# Patient Record
Sex: Female | Born: 1950 | Hispanic: No | Smoking: Never smoker
Health system: Southern US, Community
[De-identification: ages and names within clinical notes are randomized; demographics above are authoritative.]

## PROBLEM LIST (undated history)

## (undated) DIAGNOSIS — I10 Essential (primary) hypertension: Secondary | ICD-10-CM

## (undated) DIAGNOSIS — M25519 Pain in unspecified shoulder: Secondary | ICD-10-CM

## (undated) DIAGNOSIS — G8929 Other chronic pain: Secondary | ICD-10-CM

## (undated) DIAGNOSIS — E785 Hyperlipidemia, unspecified: Secondary | ICD-10-CM

## (undated) DIAGNOSIS — I2510 Atherosclerotic heart disease of native coronary artery without angina pectoris: Secondary | ICD-10-CM

## (undated) DIAGNOSIS — M549 Dorsalgia, unspecified: Secondary | ICD-10-CM

## (undated) DIAGNOSIS — E039 Hypothyroidism, unspecified: Secondary | ICD-10-CM

## (undated) HISTORY — DX: Hyperlipidemia, unspecified: E78.5

## (undated) HISTORY — DX: Essential (primary) hypertension: I10

## (undated) HISTORY — PX: BACK SURGERY: SHX140

## (undated) HISTORY — DX: Atherosclerotic heart disease of native coronary artery without angina pectoris: I25.10

## (undated) HISTORY — PX: OTHER SURGICAL HISTORY: SHX169

## (undated) HISTORY — DX: Dorsalgia, unspecified: M54.9

## (undated) HISTORY — DX: Hypothyroidism, unspecified: E03.9

## (undated) HISTORY — DX: Other chronic pain: G89.29

## (undated) HISTORY — DX: Pain in unspecified shoulder: M25.519

## (undated) HISTORY — PX: SHOULDER SURGERY: SHX246

---

## 2005-12-04 ENCOUNTER — Encounter: Admission: RE | Admit: 2005-12-04 | Discharge: 2005-12-04 | Payer: Self-pay | Admitting: Occupational Medicine

## 2006-07-16 ENCOUNTER — Other Ambulatory Visit: Admission: RE | Admit: 2006-07-16 | Discharge: 2006-07-16 | Payer: Self-pay | Admitting: Family Medicine

## 2007-01-08 ENCOUNTER — Encounter: Admission: RE | Admit: 2007-01-08 | Discharge: 2007-01-08 | Payer: Self-pay | Admitting: Family Medicine

## 2007-10-14 ENCOUNTER — Encounter: Admission: RE | Admit: 2007-10-14 | Discharge: 2007-10-14 | Payer: Self-pay | Admitting: Occupational Medicine

## 2007-10-30 ENCOUNTER — Ambulatory Visit (HOSPITAL_COMMUNITY): Admission: RE | Admit: 2007-10-30 | Discharge: 2007-10-30 | Payer: Self-pay | Admitting: Internal Medicine

## 2007-10-31 ENCOUNTER — Encounter: Admission: RE | Admit: 2007-10-31 | Discharge: 2007-12-26 | Payer: Self-pay | Admitting: Occupational Medicine

## 2008-01-30 ENCOUNTER — Other Ambulatory Visit: Admission: RE | Admit: 2008-01-30 | Discharge: 2008-01-30 | Payer: Self-pay | Admitting: Family Medicine

## 2008-08-19 ENCOUNTER — Encounter: Admission: RE | Admit: 2008-08-19 | Discharge: 2008-09-03 | Payer: Self-pay | Admitting: Family Medicine

## 2009-02-04 ENCOUNTER — Other Ambulatory Visit: Admission: RE | Admit: 2009-02-04 | Discharge: 2009-02-04 | Payer: Self-pay | Admitting: Family Medicine

## 2010-02-07 ENCOUNTER — Other Ambulatory Visit: Admission: RE | Admit: 2010-02-07 | Discharge: 2010-02-07 | Payer: Self-pay | Admitting: Family Medicine

## 2010-04-13 ENCOUNTER — Encounter: Admission: RE | Admit: 2010-04-13 | Discharge: 2010-05-30 | Payer: Self-pay | Admitting: Family Medicine

## 2011-02-06 ENCOUNTER — Inpatient Hospital Stay (HOSPITAL_COMMUNITY)
Admission: AD | Admit: 2011-02-06 | Discharge: 2011-02-08 | DRG: 287 | Disposition: A | Discharge status: Home / Self Care | Payer: 59 | Source: Ambulatory Visit | Attending: Cardiology | Admitting: Cardiology

## 2011-02-06 ENCOUNTER — Emergency Department (INDEPENDENT_AMBULATORY_CARE_PROVIDER_SITE_OTHER): Payer: 59

## 2011-02-06 ENCOUNTER — Emergency Department (HOSPITAL_BASED_OUTPATIENT_CLINIC_OR_DEPARTMENT_OTHER)
Admission: EM | Admit: 2011-02-06 | Discharge: 2011-02-06 | Disposition: A | Discharge status: Other Acute Inpatient Hospital | Payer: 59 | Attending: Emergency Medicine | Admitting: Emergency Medicine

## 2011-02-06 ENCOUNTER — Inpatient Hospital Stay: Admission: EM | Admit: 2011-02-06 | Payer: Self-pay | Admitting: Cardiology

## 2011-02-06 DIAGNOSIS — R079 Chest pain, unspecified: Secondary | ICD-10-CM

## 2011-02-06 DIAGNOSIS — E785 Hyperlipidemia, unspecified: Secondary | ICD-10-CM | POA: Diagnosis present

## 2011-02-06 DIAGNOSIS — M545 Low back pain, unspecified: Secondary | ICD-10-CM | POA: Diagnosis present

## 2011-02-06 DIAGNOSIS — R0789 Other chest pain: Principal | ICD-10-CM | POA: Diagnosis present

## 2011-02-06 DIAGNOSIS — I359 Nonrheumatic aortic valve disorder, unspecified: Secondary | ICD-10-CM

## 2011-02-06 DIAGNOSIS — Z7982 Long term (current) use of aspirin: Secondary | ICD-10-CM

## 2011-02-06 DIAGNOSIS — I2510 Atherosclerotic heart disease of native coronary artery without angina pectoris: Secondary | ICD-10-CM | POA: Diagnosis present

## 2011-02-06 DIAGNOSIS — I10 Essential (primary) hypertension: Secondary | ICD-10-CM

## 2011-02-06 DIAGNOSIS — E039 Hypothyroidism, unspecified: Secondary | ICD-10-CM | POA: Diagnosis present

## 2011-02-06 DIAGNOSIS — M25519 Pain in unspecified shoulder: Secondary | ICD-10-CM | POA: Diagnosis present

## 2011-02-06 DIAGNOSIS — G8929 Other chronic pain: Secondary | ICD-10-CM | POA: Diagnosis present

## 2011-02-06 DIAGNOSIS — Z79899 Other long term (current) drug therapy: Secondary | ICD-10-CM | POA: Insufficient documentation

## 2011-02-06 DIAGNOSIS — R0602 Shortness of breath: Secondary | ICD-10-CM | POA: Insufficient documentation

## 2011-02-06 DIAGNOSIS — R9431 Abnormal electrocardiogram [ECG] [EKG]: Secondary | ICD-10-CM | POA: Insufficient documentation

## 2011-02-06 HISTORY — PX: CARDIAC CATHETERIZATION: SHX172

## 2011-02-06 LAB — PRO B NATRIURETIC PEPTIDE: Pro B Natriuretic peptide (BNP): 111.7 pg/mL (ref 0–125)

## 2011-02-06 LAB — DIFFERENTIAL
Basophils Relative: 0 % (ref 0–1)
Eosinophils Absolute: 0.7 10*3/uL (ref 0.0–0.7)
Lymphocytes Relative: 17 % (ref 12–46)
Lymphs Abs: 1.3 10*3/uL (ref 0.7–4.0)
Monocytes Relative: 9 % (ref 3–12)
Neutro Abs: 4.6 10*3/uL (ref 1.7–7.7)

## 2011-02-06 LAB — CBC
HCT: 38.1 % (ref 36.0–46.0)
Hemoglobin: 12.8 g/dL (ref 12.0–15.0)
RBC: 4.28 MIL/uL (ref 3.87–5.11)
RDW: 11.8 % (ref 11.5–15.5)

## 2011-02-06 LAB — COMPREHENSIVE METABOLIC PANEL
CO2: 27 mEq/L (ref 19–32)
Creatinine, Ser: 0.83 mg/dL (ref 0.4–1.2)
GFR calc non Af Amer: >60 mL/min (ref >60)
Glucose, Bld: 142 mg/dL — ABNORMAL HIGH (ref 70–99)
Sodium: 140 mEq/L (ref 135–145)
Total Bilirubin: 0.4 mg/dL (ref 0.3–1.2)
Total Protein: 6.8 g/dL (ref 6.0–8.3)

## 2011-02-06 LAB — MRSA PCR SCREENING: MRSA by PCR: NEGATIVE

## 2011-02-06 LAB — CARDIAC PANEL(CRET KIN+CKTOT+MB+TROPI)
CK, MB: 2 ng/mL (ref 0.3–4.0)
Relative Index: 1.6 (ref 0.0–2.5)

## 2011-02-06 LAB — BASIC METABOLIC PANEL
Calcium: 9.9 mg/dL (ref 8.4–10.5)
Chloride: 102 mEq/L (ref 96–112)
GFR calc Af Amer: >60 mL/min (ref >60)
GFR calc non Af Amer: >60 mL/min (ref >60)
Glucose, Bld: 99 mg/dL (ref 70–99)
Sodium: 139 mEq/L (ref 135–145)

## 2011-02-06 LAB — CK TOTAL AND CKMB (NOT AT ARMC): Relative Index: 1.7 (ref 0.0–2.5)

## 2011-02-06 LAB — D-DIMER, QUANTITATIVE: D-Dimer, Quant: 0.45 ug/mL-FEU (ref 0.00–0.48)

## 2011-02-06 LAB — TSH: TSH: 4.524 u[IU]/mL — ABNORMAL HIGH (ref 0.350–4.500)

## 2011-02-07 ENCOUNTER — Inpatient Hospital Stay (HOSPITAL_COMMUNITY): Payer: 59

## 2011-02-07 DIAGNOSIS — I359 Nonrheumatic aortic valve disorder, unspecified: Secondary | ICD-10-CM

## 2011-02-07 DIAGNOSIS — R0989 Other specified symptoms and signs involving the circulatory and respiratory systems: Secondary | ICD-10-CM

## 2011-02-07 LAB — URINE MICROSCOPIC-ADD ON

## 2011-02-07 LAB — CARDIAC PANEL(CRET KIN+CKTOT+MB+TROPI)
Relative Index: 1.5 (ref 0.0–2.5)
Total CK: 108 U/L (ref 7–177)
Troponin I: <0.3 ng/mL (ref <0.30)

## 2011-02-07 LAB — TSH: TSH: 6.586 u[IU]/mL — ABNORMAL HIGH (ref 0.350–4.500)

## 2011-02-07 LAB — COMPREHENSIVE METABOLIC PANEL
ALT: 14 U/L (ref 0–35)
AST: 18 U/L (ref 0–37)
Calcium: 9 mg/dL (ref 8.4–10.5)
GFR calc non Af Amer: >60 mL/min (ref >60)
Total Bilirubin: 0.5 mg/dL (ref 0.3–1.2)
Total Protein: 7.2 g/dL (ref 6.0–8.3)

## 2011-02-07 LAB — CBC
MCH: 30.4 pg (ref 26.0–34.0)
MCHC: 33.8 g/dL (ref 30.0–36.0)
RDW: 12.1 % (ref 11.5–15.5)

## 2011-02-07 LAB — URINALYSIS, ROUTINE W REFLEX MICROSCOPIC
Bilirubin Urine: NEGATIVE
Specific Gravity, Urine: 1.006 (ref 1.005–1.030)
pH: 6 (ref 5.0–8.0)

## 2011-02-07 LAB — LIPID PANEL
Triglycerides: 130 mg/dL (ref <150)
VLDL: 26 mg/dL (ref 0–40)

## 2011-02-14 LAB — PULMONARY FUNCTION TEST

## 2011-02-21 ENCOUNTER — Encounter: Payer: Self-pay | Admitting: Physician Assistant

## 2011-02-22 ENCOUNTER — Ambulatory Visit (INDEPENDENT_AMBULATORY_CARE_PROVIDER_SITE_OTHER): Payer: 59 | Admitting: Physician Assistant

## 2011-02-22 ENCOUNTER — Encounter: Payer: Self-pay | Admitting: Physician Assistant

## 2011-02-22 ENCOUNTER — Encounter: Payer: 59 | Admitting: Physician Assistant

## 2011-02-22 VITALS — BP 135/69 | HR 58 | Ht 60.0 in | Wt 138.0 lb

## 2011-02-22 DIAGNOSIS — I10 Essential (primary) hypertension: Secondary | ICD-10-CM | POA: Insufficient documentation

## 2011-02-22 DIAGNOSIS — R0602 Shortness of breath: Secondary | ICD-10-CM

## 2011-02-22 DIAGNOSIS — I2510 Atherosclerotic heart disease of native coronary artery without angina pectoris: Secondary | ICD-10-CM | POA: Insufficient documentation

## 2011-02-22 DIAGNOSIS — E785 Hyperlipidemia, unspecified: Secondary | ICD-10-CM | POA: Insufficient documentation

## 2011-02-22 DIAGNOSIS — E039 Hypothyroidism, unspecified: Secondary | ICD-10-CM | POA: Insufficient documentation

## 2011-02-22 NOTE — Assessment & Plan Note (Signed)
Continue aspirin and statin.  Follow up with [name] in 2-3 months.

## 2011-02-22 NOTE — Assessment & Plan Note (Signed)
Controlled.  

## 2011-02-22 NOTE — Progress Notes (Signed)
History of Present Illness: Primary Cardiologist:  Dr.  [name] is a 60 y.o. female who was admitted To [facility] 5/7-5/9 with chest pain.  Cardiac catheterization demonstrated nonobstructive CAD with a 40-50% mid LAD lesion that may have been intramyocardial.  Her EF was 60%.  Echocardiogram demonstrated an EF of 55-60%, mild AI and no pericardial effusion.  BNP was normal.  D-dimer was negative.  Carotid Dopplers were negative for ICA stenosis.  Her abdominal ultrasound was normal.  She ruled out for myocardial infarction by enzymes.  Her lipids were optimal with an LDL of 69.  TSH was minimally elevated at 4.524.  She returns today for follow up.  She continues to have chest discomfort and shortness of breath with exertion.  She does wheeze.  She has an occasional cough.  Her symptoms are somewhat improved since being in the hospital.  She denies orthopnea, PND or edema.  She denies significant weight gain.  She denies syncope.  She has seen her PCP and follow up.  It sounds like she had pulmonary function tests.  She states that these are abnormal and she is pending a referral to the pulmonologist.  Past Medical History  Diagnosis Date  . Hypertension   . Hyperlipidemia     followed by PCP  . Hypothyroidism   . Back pain, chronic   . Chronic shoulder pain   . Chest pain 01/2011    unclear etiology  . Coronary artery disease     NONOBSTRUCTIVE PER CATH 02/06/11 ( mid LAD 40-50% - ? intramyocardial); EF 60%;   Echo 5/12: EF 55-60%, mild AI, normal wall motion, no pericardial eff.    Current Outpatient Prescriptions  Medication Sig Dispense Refill  . aspirin 81 MG EC tablet Take 81 mg by mouth daily.        Marland Kitchen atenolol-chlorthalidone (TENORETIC) 50-25 MG per tablet Take 0.5 tablets by mouth daily.        . fish oil-omega-3 fatty acids 1000 MG capsule Take 1 g by mouth daily.        . Glucosamine HCl 1000 MG TABS Take 1 tablet by mouth 2 (two) times daily.        Marland Kitchen  levothyroxine (SYNTHROID, LEVOTHROID) 50 MCG tablet Take 50 mcg by mouth daily.        Marland Kitchen loratadine (CLARITIN) 10 MG tablet Take 10 mg by mouth daily.        . Multiple Vitamin (MULTIVITAMIN) tablet Take 1 tablet by mouth daily.        . simvastatin (ZOCOR) 10 MG tablet Take 10 mg by mouth at bedtime.        Marland Kitchen DISCONTD: cyclobenzaprine (FLEXERIL) 10 MG tablet Take 10 mg by mouth every 6 (six) hours as needed.          Allergies: No Known Allergies  History  Substance Use Topics  . Smoking status: Never Smoker   . Smokeless tobacco: Never Used  . Alcohol Use: No    Vital Signs: BP 135/69  Pulse 58  Ht 5' (1.524 m)  Wt 138 lb (62.596 kg)  BMI 26.95 kg/m2  PHYSICAL EXAM: Well nourished, well developed, in no acute distress HEENT: normal Neck: no JVD Vascular: No carotid bruits Endocrine: No thyromegaly Cardiac:  normal S1, S2; RRR; no murmur Lungs:  clear to auscultation bilaterally, no wheezing, rhonchi or rales Abd: soft, nontender, no hepatomegaly Ext: no edema Skin: warm and dry Neuro:  CNs 2-12 intact, no  focal abnormalities noted  EKG:  Sinus bradycardia, heart rate 55, left axis deviation, nonspecific ST-T wave changes, incomplete right bundle branch block, no significant change when compared to prior tracings  ASSESSMENT AND PLAN:

## 2011-02-22 NOTE — Assessment & Plan Note (Signed)
Managed by PCP.  Goal LDL < 70. 

## 2011-02-22 NOTE — Patient Instructions (Signed)
Your physician recommends that you schedule a follow-up appointment in: 2-3 MONTHS WITH [name] AS PER SCOTT WEAVER, PA-C.

## 2011-02-22 NOTE — Assessment & Plan Note (Signed)
Her symptoms do not appear to be cardiac.  It sounds as though she has had a recent pulmonary function test that was abnormal.  I suspect the etiology of her symptoms are related to a pulmonary issue.  I have encouraged her to go ahead and see a pulmonologist for further evaluation.

## 2011-03-02 NOTE — Cardiovascular Report (Signed)
NAMEANASTASYA, Michele Cox                    ACCOUNT NO.:  [id number]  MEDICAL RECORD NO.:  [id number]           PATIENT TYPE:  I  LOCATION:  2922                         FACILITY:  [facility]  PHYSICIAN:  Arturo Morton. Riley Kill, MD, FACCDATE OF BIRTH:  [birth year]  DATE OF PROCEDURE:  02/06/2011 DATE OF DISCHARGE:                           CARDIAC CATHETERIZATION   INDICATIONS:  Ms. Soria is a 60 year old who has had several Droge history of chest pain and shortness of breath.  The mechanisms of this are unclear.  [facility] with ongoing chest pain.  EKG was not diagnostic.  First set of enzymes were negative.  She was seen by Dr. [name], referred for urgent cardiac catheterization.  The patient arrived from the external facility to the cath lab directly where she was examined and the data evaluated.  PROCEDURE: 1. Left heart catheterization. 2. Selective coronary arteriography. 3. Selective left ventriculography. 4. Aortic root aortography.  DESCRIPTION OF PROCEDURE:  The patient was brought to the cath lab and prepped and draped in usual fashion.  We obtained informed consent. Following this, 1 mg of Versed and 25 of fentanyl was given followed by a second dose of Versed.  The right femoral artery was easily entered through an anterior puncture, 5-French sheath was placed.  Views of the left coronary artery were obtained and carefully analyzed.  Views of the right coronary artery were obtained with [facility] catheter.  Central aortic and left ventricular pressures were measured with pigtail. Ventriculography was performed in the RAO projection.  Following a pressure, pullback aortography was performed.  There were no major complications.  ACT was checked and she was taken to the holding area for sheath removal.  I have spoke with her family about the findings. She is admitted for further observation.  HEMODYNAMIC DATA: 1. The central aortic pressure is 145/70, mean  100. 2. LV pressure 152/11. 3. There was not a gradient pullback across the aortic valve.  ANGIOGRAPHIC DATA: 1. Ventriculography done in the RAO projection reveals well-preserved     global systolic function.  No segmental abnormalities or     contraction were identified.  Ejection fraction would be estimated     in the range of 60%. 2. The aortic root demonstrates no evidence of significant aortic     regurgitation.  The aortic root does not appear to be enlarged.  No     obvious dissection lines are observed.  Takeoff from the great     vessels appears to be appropriate. 3. The left main is free of critical disease.  He has an unusual     appearance on the initial views, but multiple views lay this out     demonstrating that the LAD itself has a turn in its takeoff in the     views in which it looks abnormal.  There is also a small first     marginal branch which has an upward takeoff and retrograde     appearance, which could also provide this. 4. The LAD courses to the apex.  After the first two  diagonal     branches, there is some narrowing in the LAD of about 40%-50%.     This was brought out in several views including up to and involving     left lateral view.  The mechanism of this narrowing is unclear.  It     could be part of intramyocardial.  It could represent     atherosclerotic disease less likely option would be external     compression from spontaneous dissection, although I there is not     other compromise of branch vessels to suggest external hematoma.     The distal LAD wraps the apex. 5. The circumflex marginal provides a trifurcating marginal branch     which is very large and free of critical disease. 6. The right coronary artery is large-caliber vessel providing     posterior descending and posterolateral branch.  CONCLUSION: 1. Well-preserved left ventricular function without wall motion     abnormality. 2. Aortic root without evidence of significant  dissection. 3. Modest narrowing of the mid left anterior descending artery without     critical narrowing despite ongoing pain.  Mechanism of pain is     unclear.  DISPOSITION:  The patient will be admitted and serial enzymes obtained. We will get a D-dimer.  Other sources of chest pain will be sought.  She will be monitored for approximately 48 hours.     Arturo Morton. Riley Kill, MD, [facility]     TDS/MEDQ  D:  02/06/2011  T:  02/07/2011  Job:  [id number]  cc:   Sunnie Nielsen, MD CV Laboratory [place], Alita Chyle  Electronically Signed by Shawnie Pons MD [facility] on 03/02/2011 09:18:20 AM

## 2011-03-02 NOTE — H&P (Signed)
Michele Cox, Michele Cox                    ACCOUNT NO.:  [id number]  MEDICAL RECORD NO.:  [id number]           PATIENT TYPE:  I  LOCATION:  2922                         FACILITY:  [facility]  PHYSICIAN:  Arturo Morton. Riley Kill, MD, FACCDATE OF BIRTH:  1950-11-09  DATE OF ADMISSION:  02/06/2011 DATE OF DISCHARGE:                             HISTORY & PHYSICAL   CHIEF COMPLAINT:  Chest pain.  HISTORY OF PRESENT ILLNESS:  The patient is a 60 year old female with a past medical history of hypertension, hyperlipidemia, hypothyroidism, lower back pain who presented to the [facility] today complaining of retrosternal chest pain ongoing for the past 2 days.  The pain was constant 6/10 in intensity, no radiation.  Chest pain appeared pleuritic as it worsened with inspiration, the patient denied any other complaints.  Upon arrival to [facility], she was emergently catheterized given her moderate risk of coronary artery disease and nonspecific changes on her EKGs.  [name] performed a cardiac catheterization which was negative for critical disease.  Her first cardiac enzyme at that time came back negative for myocardial injury.  The patient tolerated the procedure well.  She is sent to telemetry for further monitoring and workup of her chest pain.  PAST MEDICAL HISTORY:  Hypothyroidism, hypertension, hyperlipidemia, chronic lower back pain, and shoulder pain.  ALLERGIES:  No known drug allergies.  MEDICATIONS: 1. Synthroid 50 mcg. 2. Simvastatin 10 mg. 3. Atenolol/hydrochlorothiazide 50/25 mg daily. 4. Flexeril 10 mg q.6 h. p.r.n. back pain. 5. Loratadine 10 mg tablets daily. 6. Multivitamin 7. Glucosamine. 8. Fish oil.  SOCIAL HISTORY:  The patient lives in [place].  Denies any tobacco, alcohol or drug abuse.  FAMILY HISTORY:  Significant for premature coronary artery disease.  REVIEW OF SYSTEMS:  The patient denies fever, chills, sweating, nausea, vomiting, complains of  chest pain or shortness of breath.  No numbness. No neurological findings.  The patient is otherwise negative for review of systems except for per HPI.  PHYSICAL EXAMINATION:  VITAL SIGNS:  Temperature 98.4, pulse 54, respiratory rate 16, blood pressure 156/85, O2 saturation 97% on room air. GENERAL:  No acute distress. HEENT:  Normocephalic, atraumatic.  Pupils are equal, round, and reactive to light. NECK:  Supple.  No bruits.  No JVD. CARDIOVASCULAR:  Heart regular rate and rhythm.  No murmurs, rubs or gallops. LUNGS:  Clear to auscultation. SKIN:  No rashes. ABDOMEN:  Soft, nontender, nondistended.  Negative hepatosplenomegaly. EXTREMITIES:  No cyanosis, clubbing or edema. NEUROLOGICAL:  Alert and oriented x3.  Cranial nerves II-XII are grossly intact.  RADIOLOGY:  Chest x-ray shows accentuated interstitial markings.  No acute findings present.  EKG, rate 56 rhythm was sinus, axis is normal. PR interval was 164, QRS 96, QTc 405.  There was no specific ST or T wave changes.  No signs of hypertrophy.  No prior EKG to compare to.  LABORATORY FINDINGS:  White blood cell count 7.3, hemoglobin 12.8, hematocrit 38.1, platelet count 196.  Sodium 139, potassium 3.6, chloride 102, bicarb 26, BUN 9, creatinine 0.7, glucose 99.  First cardiac enzymes CK 167,  CK-MB 2.7, troponin less than 0.3.  ASSESSMENT AND PLAN:  Chest pain.  The patient was transferred here for chest pain for the past 2 days for which she went under emergent catheterization.  This was negative for critical lesions that would explain her chest pain.  The patient will be admitted to telemetry for further workup and monitoring.  Her chest x-ray was negative which  rules out pneumonia or pneumothorax.  Because of her presentation,  we will order a D-dimer to further workup her chest pain for potential  pulmonarym embolism.  Other differential diagnosis includes chronic back pain, GERD or musculoskeletal pain.  We will  continue the patient's monitoring on telemetry, cycle cardiac enzymes x3, give the patient aspirin, continue on her home medications and work to adjust her medication regiment for optimal medical management.     Darnelle Maffucci, MD   ______________________________ Arturo Morton. Riley Kill, MD, [facility]    PT/MEDQ  D:  02/06/2011  T:  02/07/2011  Job:  [id number]  Electronically Signed by Darnelle Maffucci  on 02/11/2011 11:48:48 AM Electronically Signed by Shawnie Pons MD [facility] on 03/02/2011 09:18:27 AM

## 2011-03-23 NOTE — Discharge Summary (Signed)
Michele Cox, Michele Cox                    ACCOUNT NO.:  [id number]  MEDICAL RECORD NO.:  [id number]           PATIENT TYPE:  I  LOCATION:  2922                         FACILITY:  [facility]  PHYSICIAN:  Veverly Fells. [name], MD  DATE OF BIRTH:  1951-04-22  DATE OF ADMISSION:  02/06/2011 DATE OF DISCHARGE:  02/08/2011                              DISCHARGE SUMMARY   PRIMARY CARDIOLOGIST:  Maisie Fus D. Riley Kill, MD, [facility]  PRIMARY CARE PROVIDER:  [name], MD at [facility].  DISCHARGE DIAGNOSES: 1. Chest pain, unclear etiology.     a.     Nonobstructive coronary artery disease per cardiac      catheterization. 2. Nonobstructive coronary artery disease per cardiac catheterization     on Feb 06, 2011.     a.     Moderate narrowing of the mid left anterior descending      artery without critical narrowing.     b.     Well-preserved left ventricular function without wall motion      abnormalities.  SECONDARY DIAGNOSES: 1. Hypertension. 2. Hyperlipidemia. 3. Hypothyroidism. 4. Chronic low back and shoulder pain.  ALLERGIES:  No known drug allergies.  PROCEDURES/DIAGNOSTICS PERFORMED DURING HOSPITALIZATION: 1. Left heart catheterization with selective coronary arteriography,     selective left ventriculography, and aortic root, aortography on     Feb 06, 2011.     a.     Well-preserved left ventricular function without wall motion      abnormality, ejection fraction 60%.     b.     Left anterior descending with some narrowing about 40-50%      after the first two diagonal branches.  Other vessels free of      critical disease.     c.     Aortic root without evidence of significant dissection. 2. Abdominal ultrasound on Feb 07, 2011:  Normal abdominal ultrasound. 3. Chest x-ray on Feb 07, 2011:  No acute cardiopulmonary disease     process. 4. Echo on Feb 07, 2011:  Left ventricle systolic function was normal,     estimated ejection fraction 55-60%.  No regional wall motion  abnormalities.  There is a mild focal basilar hypertrophy of the     septum.  Mild aortic regurgitation. 5. Carotid Dopplers:  Final report pending.  Preliminary result showed     no evidence of hemodynamically significant internal carotid artery     stenosis, vertebral artery flow is antegrade.  REASON FOR HOSPITALIZATION:  This is a 60 year old female with the above- stated problem list who presented to [facility] on the [name] of admission with complaints of retrosternal chest pain for the last 2 days.  Chest pain appeared pleuritic as it was worse with inspiration. The patient's EKG was nondiagnostic.  Her first set of cardiac markers was negative.  With the patient's moderate risk of coronary artery disease and nonspecific changes, Dr. [name] referred the patient for urgent cardiac catheterization.  Risks, benefits, and indications were discussed with the patient, and she agreed to proceed.  HOSPITAL COURSE:  The patient  was taken to the cardiac cath lab by [name], informed consent was obtained.  Catheterization demonstrated moderate narrowing of the mid left anterior descending artery without critical narrowing despite patient's ongoing pain.  I felt mechanism of the pain was unclear.  The patient was brought back to the floor for further workup.  A D-dimer was obtained to rule out for pulmonary embolus, this was within normal limits.  The patient did rule out for myocardial infarction with cardiac enzymes being negative x3.  A 2-D echo was obtained that was negative for pericardial effusion.  Her ejection fraction was within normal limits.  She did have mild focal basilar hypertrophy of the septum.  Of note, the patient did tolerate cardiac catheterization well in her cath site, right groin was without signs of hematoma.  The patient had no further complaints of chest pain.  She was mildly dyspneic, but this was improving during hospitalization.  An  abdominal ultrasound was also obtained, this was normal.  The patient did have carotid Dopplers as it was noted that she had a left carotid bruit. Pulmonary results showed no evidence of hemodynamically significant ICA stenosis.  There was vertebral artery flow in the antegrade direction. Final report is currently pending.  On the [place] of discharge, Dr. [name] evaluated the patient and noted her stable for home.  She was ambulating without difficulty.  Again, she had no further complaints of chest pain.  Her shortness of breath had improved.  Of note, the patient states she had been on atenolol/chlorthalidone, but her pharmacy was out of the combination meds and therefore given her atenolol and hydrochlorothiazide as separate pills.  The patient had discontinued use of her hydrochlorothiazide. Her combination med has been restarted during admission.  This will be continued at discharge.  Discharge plans and instructions were discussed with the patient.  She voiced understanding.  DISCHARGE LABORATORIES:  TSH 4.524, free T4 1.23.  Cholesterol 144, triglycerides 138, HDL 49, LDL 69.  Hemoglobin A1c 5.  Cardiac enzymes negative x3.  DISCHARGE MEDICATIONS: 1. Aspirin 81 mg 1 tablet daily. 2. Atenolol/chlorthalidone 50/25 mg half tablet daily. 3. Fish oil 1000 mg 1 capsule daily. 4. Glucosamine 1000 mg 2 tablets daily. 5. Levothyroxine 50 mcg 1 tablet daily. 6. Loratadine 10 mg 1 tablet daily. 7. Multivitamin 1 tablet daily. 8. Simvastatin 10 mg daily. 9. Please stop taking hydrochlorothiazide.  FOLLOWUP PLANS AND INSTRUCTIONS: 1. The patient will follow up with [name] in 10-14 days, the     office will call to schedule this appointment. 2. Follow up with primary care provider, [name] as previously     scheduled. 3. The patient is to increase activity slowly.  She may shower, but no     bathing.  No lifting for 1 week greater than 5 pounds.  No driving     for 1 [place].  No  sexual activity for 1 week.  She is to keep her cath     site clean and dry and call our office for any problems. 4. The patient is to continue low-sodium heart-healthy diet. 5. The patient to avoid straining. 6. The patient is to stop any activity that causes chest pain or     shortness of breath. 7. She is to call our office in the interim for any problems or     concerns.  DURATION OF DISCHARGE: Greater than 30 minutes with physician and physician extender time.    Leonette Monarch, PA-C   ______________________________ Casimiro Needle  Karren Burly, MD    NB/MEDQ  D:  02/08/2011  T:  02/08/2011  Job:  [id number]  cc:   Arturo Morton. Riley Kill, MD, [facility] Docia Chuck, MD  Electronically Signed by Alen Blew P.A. on 02/20/2011 11:50:23 AM Electronically Signed by Tonny Bollman MD on 03/23/2011 07:33:24 AM

## 2011-03-24 ENCOUNTER — Ambulatory Visit (INDEPENDENT_AMBULATORY_CARE_PROVIDER_SITE_OTHER): Payer: 59 | Admitting: Emergency Medicine

## 2011-03-24 ENCOUNTER — Encounter: Payer: Self-pay | Admitting: Emergency Medicine

## 2011-03-24 VITALS — BP 112/76 | HR 50 | Temp 98.2°F | Ht 60.0 in | Wt 136.2 lb

## 2011-03-24 DIAGNOSIS — R0602 Shortness of breath: Secondary | ICD-10-CM

## 2011-03-24 NOTE — Assessment & Plan Note (Signed)
-   will obtain and review PFT from [facility].  - ROV next available

## 2011-03-24 NOTE — Progress Notes (Signed)
Subjective:    Patient ID: Michele Cox, female    DOB: 21-Apr-1951, 60 y.o.   MRN: [id number]  HPI 60 yo never smoker, referred by Dr Luz Brazen for dyspnea. She began to experience dyspnea in May '12 after she stopped HCTZ. Was admitted with SOB and chest pain 02/08/11. Cardiac catheterization demonstrated nonobstructive CAD with a 40-50% mid LAD lesion that may have been intramyocardial. Her EF was 60%. Echocardiogram demonstrated an EF of 55-60%, mild AI and no pericardial effusion. Her breathing is somewhat improved but not baseline. She hears wheezing at times, has a dry cough. She reports that PFT have been done at [facility]. She has not been started on any meds yet - she thinks she may have benefited from the BD that they gave her for her spirometry.    Review of Systems  Respiratory: Positive for cough and shortness of breath.   Cardiovascular: Positive for palpitations.   Past Medical History  Diagnosis Date  . Hypertension   . Hyperlipidemia     followed by PCP  . Hypothyroidism   . Back pain, chronic   . Chronic shoulder pain   . Chest pain 01/2011    unclear etiology  . Coronary artery disease     NONOBSTRUCTIVE PER CATH 02/06/11 ( mid LAD 40-50% - ? intramyocardial); EF 60%;   Echo 5/12: EF 55-60%, mild AI, normal wall motion, no pericardial eff.     Family History  Problem Relation Age of Onset  . Coronary artery disease      PREMATURE CAD IN FAMILY     History   Social History  . Marital Status: Unknown    Spouse Name: N/A    Number of Children: N/A  . Years of Education: N/A   Occupational History  . disabled    Social History Main Topics  . Smoking status: Never Smoker   . Smokeless tobacco: Never Used  . Alcohol Use: No  . Drug Use: No  . Sexually Active: Not on file   Other Topics Concern  . Not on file   Social History Narrative  . No narrative on file     No Known Allergies   Outpatient Prescriptions Prior to Visit  Medication Sig Dispense  Refill  . aspirin 81 MG EC tablet Take 81 mg by mouth daily.        . fish oil-omega-3 fatty acids 1000 MG capsule Take 1 g by mouth daily.        . Glucosamine HCl 1000 MG TABS Take 1 tablet by mouth 2 (two) times daily.        Marland Kitchen levothyroxine (SYNTHROID, LEVOTHROID) 50 MCG tablet Take 50 mcg by mouth daily.        Marland Kitchen loratadine (CLARITIN) 10 MG tablet Take 10 mg by mouth daily.        . Multiple Vitamin (MULTIVITAMIN) tablet Take 1 tablet by mouth daily.        . simvastatin (ZOCOR) 10 MG tablet Take 10 mg by mouth at bedtime.        Marland Kitchen atenolol-chlorthalidone (TENORETIC) 50-25 MG per tablet Take 0.5 tablets by mouth daily.               Objective:   Physical Exam Gen: Pleasant, well-nourished, in no distress,  normal affect  ENT: No lesions,  mouth clear,  oropharynx clear, no postnasal drip  Neck: No JVD, no TMG, no carotid bruits  Lungs: No use of accessory muscles, no dullness to percussion, clear  without rales or rhonchi  Cardiovascular: RRR, heart sounds normal, no murmur or gallops, no peripheral edema  Musculoskeletal: R forearm with post-trauma deformity, scars  Neuro: alert, non focal  Skin: Warm, no lesions or rashes       Assessment & Plan:  Shortness of breath - will obtain and review PFT from [place].  - ROV next available

## 2011-03-24 NOTE — Patient Instructions (Signed)
We will obtain and review your breathing tests from [facility] We will review your CXR's Follow up with Dr Delton Coombes next available opening

## 2011-04-06 ENCOUNTER — Ambulatory Visit: Payer: 59 | Admitting: Emergency Medicine

## 2011-04-26 ENCOUNTER — Ambulatory Visit: Payer: 59 | Admitting: Cardiology

## 2011-05-03 ENCOUNTER — Ambulatory Visit (INDEPENDENT_AMBULATORY_CARE_PROVIDER_SITE_OTHER): Payer: 59 | Admitting: Emergency Medicine

## 2011-05-03 ENCOUNTER — Encounter: Payer: Self-pay | Admitting: Emergency Medicine

## 2011-05-03 VITALS — BP 108/72 | HR 86 | Temp 97.8°F | Ht 60.0 in | Wt 140.2 lb

## 2011-05-03 DIAGNOSIS — R0989 Other specified symptoms and signs involving the circulatory and respiratory systems: Secondary | ICD-10-CM

## 2011-05-03 DIAGNOSIS — R053 Chronic cough: Secondary | ICD-10-CM | POA: Insufficient documentation

## 2011-05-03 DIAGNOSIS — R05 Cough: Secondary | ICD-10-CM

## 2011-05-03 DIAGNOSIS — R0600 Dyspnea, unspecified: Secondary | ICD-10-CM

## 2011-05-03 DIAGNOSIS — R0602 Shortness of breath: Secondary | ICD-10-CM

## 2011-05-03 NOTE — Assessment & Plan Note (Signed)
Add Nasonex to loratadine

## 2011-05-03 NOTE — Patient Instructions (Addendum)
We will start Ventolin, 2 puffs up to every 4 hours if needed for shortness of breath.  We will perform full Pulmonary Function Testing Continue loratadine (Claritin) 10mg  daily Start Nasonex 2 sprays each nostril daily Follow up with Dr Delton Coombes in 6 weeks

## 2011-05-03 NOTE — Assessment & Plan Note (Addendum)
PFT from [place] showed mixed disease, possible obstruction (02/14/11). ? Etiology of the restriction. CXR w ? Hyperinflation 5/12 - repeat spirometry today shows mixed disease - order full PFT - start SABA prn to see if she benefits.  - rov 1 mon

## 2011-05-03 NOTE — Progress Notes (Signed)
  Subjective:    Patient ID: Michele Cox, female    DOB: May 14, 1951, 60 y.o.   MRN: [id number]  HPI 60 yo never smoker, referred by Dr Luz Brazen for dyspnea. She began to experience dyspnea in May '12 after she stopped HCTZ. Was admitted with SOB and chest pain 02/08/11. Cardiac catheterization demonstrated nonobstructive CAD with a 40-50% mid LAD lesion that may have been intramyocardial. Her EF was 60%. Echocardiogram demonstrated an EF of 55-60%, mild AI and no pericardial effusion. Her breathing is somewhat improved but not baseline. She hears wheezing at times, has a dry cough. She reports that PFT have been done at [facility]. She has not been started on any meds yet - she thinks she may have benefited from the BD that they gave her for her spirometry.   ROV 05/03/11 -- returns for follow up for DOE. No real changes from last time, still SOB when she rushes around, climbs stairs. She hasn't heard any wheeze with exertion. Her cough is about the same, dry. ? 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   Cardiovascular: Positive for palpitations.       Objective:   Physical Exam Gen: Pleasant, well-nourished, in no distress,  normal affect  ENT: No lesions,  mouth clear,  oropharynx clear, no postnasal drip  Neck: No JVD, no TMG, no carotid bruits  Lungs: No use of accessory muscles, no dullness to percussion, clear without rales or rhonchi  Cardiovascular: RRR, heart sounds normal, no murmur or gallops, no peripheral edema  Musculoskeletal: R forearm with post-trauma deformity, scars  Neuro: alert, non focal  Skin: Warm, no lesions or rashes      Assessment & Plan:  Shortness of breath - will obtain and review PFT from [place].  - ROV next available

## 2011-06-15 ENCOUNTER — Ambulatory Visit: Payer: 59 | Admitting: Emergency Medicine

## 2011-07-04 ENCOUNTER — Ambulatory Visit (INDEPENDENT_AMBULATORY_CARE_PROVIDER_SITE_OTHER): Payer: 59 | Admitting: Emergency Medicine

## 2011-07-04 ENCOUNTER — Encounter: Payer: Self-pay | Admitting: Emergency Medicine

## 2011-07-04 DIAGNOSIS — R0602 Shortness of breath: Secondary | ICD-10-CM

## 2011-07-04 DIAGNOSIS — R05 Cough: Secondary | ICD-10-CM

## 2011-07-04 DIAGNOSIS — R053 Chronic cough: Secondary | ICD-10-CM

## 2011-07-04 LAB — PULMONARY FUNCTION TEST

## 2011-07-04 NOTE — Assessment & Plan Note (Signed)
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 Ha shad improvement on SABA - continue the Ventolin prn - rov in 1 yr

## 2011-07-04 NOTE — Assessment & Plan Note (Signed)
Improved with nasonex and loratadine.

## 2011-07-04 NOTE — Progress Notes (Signed)
PFT done today. 

## 2011-07-04 NOTE — Patient Instructions (Signed)
Your breathing tests show evidence for some mild asthma.  Please continue to use Ventolin 2 puffs if needed for shortness of breath Continue your Claritin and Nasonex  Follow with Dr Delton Coombes in 1 year or sooner if you have any problems.

## 2011-07-04 NOTE — Progress Notes (Signed)
  Subjective:    Patient ID: Michele Cox, female    DOB: 08-Jun-1951, 60 y.o.   MRN: [id number]  HPI 60 yo never smoker, referred by Dr Luz Brazen for dyspnea. She began to experience dyspnea in May '12 after she stopped HCTZ. Was admitted with SOB and chest pain 02/08/11. Cardiac catheterization demonstrated nonobstructive CAD with a 40-50% mid LAD lesion that may have been intramyocardial. Her EF was 60%. Echocardiogram demonstrated an EF of 55-60%, mild AI and no pericardial effusion. Her breathing is somewhat improved but not baseline. She hears wheezing at times, has a dry cough. She reports that PFT have been done at [facility]. She has not been started on any meds yet - she thinks she may have benefited from the BD that they gave her for her spirometry.   ROV 05/03/11 -- returns for follow up for DOE. No real changes from last time, still SOB when she rushes around, climbs stairs. She hasn't heard any wheeze with exertion. Her cough is about the same, dry. ? 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 Full PFT done today with mixed spiro, restriction on volumes, decreased DLCO that corrects for [facility]. We started Ventolin prn, she has been using about 3 times a week. In retrospect she tells me that she may have had asthma as a girl. The loratadine and nasonex are helping her nasal gtt. Cough is better, still some white mucous.      Objective:   Physical Exam Gen: Pleasant, well-nourished, in no distress,  normal affect  ENT: No lesions,  mouth clear,  oropharynx clear, no postnasal drip  Neck: No JVD, no TMG, no carotid bruits  Lungs: No use of accessory muscles, no dullness to percussion, clear without rales or rhonchi  Cardiovascular: RRR, heart sounds normal, no murmur or gallops, no peripheral edema  Musculoskeletal: R forearm with post-trauma deformity, scars  Neuro: alert, non focal  Skin: Warm, no lesions or rashes      Assessment & Plan:    Chronic cough Improved with nasonex and loratadine.   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 Ha shad improvement on SABA - continue the Ventolin prn - rov in 1 yr

## 2015-04-18 ENCOUNTER — Encounter (HOSPITAL_BASED_OUTPATIENT_CLINIC_OR_DEPARTMENT_OTHER): Payer: Self-pay | Admitting: *Deleted

## 2015-04-18 ENCOUNTER — Emergency Department (HOSPITAL_BASED_OUTPATIENT_CLINIC_OR_DEPARTMENT_OTHER)
Admission: EM | Admit: 2015-04-18 | Discharge: 2015-04-18 | Disposition: A | Discharge status: Home / Self Care | Payer: Medicare Other | Attending: Emergency Medicine | Admitting: Emergency Medicine

## 2015-04-18 DIAGNOSIS — Z9889 Other specified postprocedural states: Secondary | ICD-10-CM | POA: Insufficient documentation

## 2015-04-18 DIAGNOSIS — Z79899 Other long term (current) drug therapy: Secondary | ICD-10-CM | POA: Diagnosis not present

## 2015-04-18 DIAGNOSIS — I2510 Atherosclerotic heart disease of native coronary artery without angina pectoris: Secondary | ICD-10-CM | POA: Insufficient documentation

## 2015-04-18 DIAGNOSIS — Z7952 Long term (current) use of systemic steroids: Secondary | ICD-10-CM | POA: Diagnosis not present

## 2015-04-18 DIAGNOSIS — Z7982 Long term (current) use of aspirin: Secondary | ICD-10-CM | POA: Diagnosis not present

## 2015-04-18 DIAGNOSIS — Z791 Long term (current) use of non-steroidal anti-inflammatories (NSAID): Secondary | ICD-10-CM | POA: Insufficient documentation

## 2015-04-18 DIAGNOSIS — E039 Hypothyroidism, unspecified: Secondary | ICD-10-CM | POA: Diagnosis not present

## 2015-04-18 DIAGNOSIS — E785 Hyperlipidemia, unspecified: Secondary | ICD-10-CM | POA: Insufficient documentation

## 2015-04-18 DIAGNOSIS — G8929 Other chronic pain: Secondary | ICD-10-CM | POA: Insufficient documentation

## 2015-04-18 DIAGNOSIS — M546 Pain in thoracic spine: Secondary | ICD-10-CM | POA: Insufficient documentation

## 2015-04-18 LAB — CBC WITH DIFFERENTIAL/PLATELET
BASOS PCT: 0 % (ref 0–1)
Basophils Absolute: 0 10*3/uL (ref 0.0–0.1)
EOS ABS: 0.3 10*3/uL (ref 0.0–0.7)
Eosinophils Relative: 3 % (ref 0–5)
HCT: 38.3 % (ref 36.0–46.0)
Hemoglobin: 12.7 g/dL (ref 12.0–15.0)
Lymphocytes Relative: 23 % (ref 12–46)
Lymphs Abs: 1.8 10*3/uL (ref 0.7–4.0)
MCH: 30.5 pg (ref 26.0–34.0)
MCHC: 33.2 g/dL (ref 30.0–36.0)
MCV: 91.8 fL (ref 78.0–100.0)
Monocytes Absolute: 0.7 10*3/uL (ref 0.1–1.0)
Monocytes Relative: 9 % (ref 3–12)
NEUTROS PCT: 65 % (ref 43–77)
Neutro Abs: 5 10*3/uL (ref 1.7–7.7)
PLATELETS: 226 10*3/uL (ref 150–400)
RBC: 4.17 MIL/uL (ref 3.87–5.11)
RDW: 11.9 % (ref 11.5–15.5)
WBC: 7.8 10*3/uL (ref 4.0–10.5)

## 2015-04-18 LAB — BASIC METABOLIC PANEL
Anion gap: 8 (ref 5–15)
BUN: 20 mg/dL (ref 6–20)
CHLORIDE: 100 mmol/L — AB (ref 101–111)
CO2: 25 mmol/L (ref 22–32)
Calcium: 9.3 mg/dL (ref 8.9–10.3)
Creatinine, Ser: 0.92 mg/dL (ref 0.44–1.00)
GFR calc Af Amer: >60 mL/min (ref >60)
GFR calc non Af Amer: >60 mL/min (ref >60)
GLUCOSE: 94 mg/dL (ref 65–99)
POTASSIUM: 3.6 mmol/L (ref 3.5–5.1)
SODIUM: 133 mmol/L — AB (ref 135–145)

## 2015-04-18 MED ORDER — OXYCODONE-ACETAMINOPHEN 5-325 MG PO TABS: ORAL_TABLET | ORAL | Status: AC

## 2015-04-18 MED ORDER — HYDROMORPHONE HCL 1 MG/ML IJ SOLN
0.5000 mg | Freq: Once | INTRAMUSCULAR | Status: AC
  Administered 2015-04-18: 0.5 mg via INTRAVENOUS
  Filled 2015-04-18: qty 1

## 2015-04-18 MED ORDER — DIAZEPAM 5 MG/ML IJ SOLN
5.0000 mg | Freq: Once | INTRAMUSCULAR | Status: AC
  Administered 2015-04-18: 5 mg via INTRAVENOUS
  Filled 2015-04-18: qty 2

## 2015-04-18 MED ORDER — MORPHINE SULFATE 4 MG/ML IJ SOLN
4.0000 mg | INTRAMUSCULAR | Status: DC | PRN
  Administered 2015-04-18: 4 mg via INTRAVENOUS
  Filled 2015-04-18 (×2): qty 1

## 2015-04-18 MED ORDER — METHYLPREDNISOLONE SODIUM SUCC 125 MG IJ SOLR
125.0000 mg | Freq: Once | INTRAMUSCULAR | Status: AC
  Administered 2015-04-18: 125 mg via INTRAVENOUS
  Filled 2015-04-18: qty 2
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
## 2015-04-18 NOTE — Discharge Instructions (Signed)
Take percocet for breakthrough pain, do not drink alcohol, drive, care for children or do other critical tasks while taking percocet.  Please follow with your primary care doctor in the next 2 days for a check-up. They must obtain records for further management.   Do not hesitate to return to the Emergency Department for any new, worsening or concerning symptoms.    Back Pain, Adult Low back pain is very common. About 1 in 5 people have back pain.The cause of low back pain is rarely dangerous. The pain often gets better over time.About half of people with a sudden onset of back pain feel better in just 2 weeks. About 8 in 10 people feel better by 6 weeks.  CAUSES Some common causes of back pain include:  Strain of the muscles or ligaments supporting the spine.  Wear and tear (degeneration) of the spinal discs.  Arthritis.  Direct injury to the back. DIAGNOSIS Most of the time, the direct cause of low back pain is not known.However, back pain can be treated effectively even when the exact cause of the pain is unknown.Answering your caregiver's questions about your overall health and symptoms is one of the most accurate ways to make sure the cause of your pain is not dangerous. If your caregiver needs more information, he or she may order lab work or imaging tests (X-rays or MRIs).However, even if imaging tests show changes in your back, this usually does not require surgery. HOME CARE INSTRUCTIONS For many people, back pain returns.Since low back pain is rarely dangerous, it is often a condition that people can learn to [facility] own.   Remain active. It is stressful on the back to sit or stand in one place. Do not sit, drive, or stand in one place for more than 30 minutes at a time. Take short walks on level surfaces as soon as pain allows.Try to increase the length of time you walk each Floor.  Do not stay in bed.Resting more than 1 or 2 days can delay your recovery.  Do not  avoid exercise or work.Your body is made to move.It is not dangerous to be active, even though your back may hurt.Your back will likely heal faster if you return to being active before your pain is gone.  Pay attention to your body when you bend and lift. Many people have less discomfortwhen lifting if they bend their knees, keep the load close to their bodies,and avoid twisting. Often, the most comfortable positions are those that put less stress on your recovering back.  Find a comfortable position to sleep. Use a firm mattress and lie on your side with your knees slightly bent. If you lie on your back, put a pillow under your knees.  Only take over-the-counter or prescription medicines as directed by your caregiver. Over-the-counter medicines to reduce pain and inflammation are often the most helpful.Your caregiver may prescribe muscle relaxant drugs.These medicines help dull your pain so you can more quickly return to your normal activities and healthy exercise.  Put ice on the injured area.  Put ice in a plastic bag.  Place a towel between your skin and the bag.  Leave the ice on for 15-20 minutes, 03-04 times a [place] for the first 2 to 3 days. After that, ice and heat may be alternated to reduce pain and spasms.  Ask your caregiver about trying back exercises and gentle massage. This may be of some benefit.  Avoid feeling anxious or stressed.Stress increases muscle tension  and can worsen back pain.It is important to recognize when you are anxious or stressed and learn ways to manage it.Exercise is a great option. SEEK MEDICAL CARE IF:  You have pain that is not relieved with rest or medicine.  You have pain that does not improve in 1 week.  You have new symptoms.  You are generally not feeling well. SEEK IMMEDIATE MEDICAL CARE IF:   You have pain that radiates from your back into your legs.  You develop new bowel or bladder control problems.  You have unusual weakness  or numbness in your arms or legs.  You develop nausea or vomiting.  You develop abdominal pain.  You feel faint. Document Released: 09/18/2005 Document Revised: 03/19/2012 Document Reviewed: 01/20/2014 [facility] Patient Information 2015 [place], [place]. This information is not intended to replace advice given to you by your health care provider. Make sure you discuss any questions you have with your health care provider.

## 2015-04-18 NOTE — ED Provider Notes (Signed)
Complains of midthoracic back pain for the past 2 weeks worse with changing positions. She is been treated with gabapentin and NSAIDs without relief. Patient had MRI scan 2 days ago. She is awaiting result which she will get tomorrow and follow-up with her orthopedist   Doug SouSam Leniyah Martell, MD 04/18/15 [phone]

## 2015-04-18 NOTE — ED Notes (Signed)
Pt reports upper back pain x 2 weeks- has been eval by PCP and ortho- had MRI on Friday and dx with "pinched nerve"- taking gabapentin and celebrex without relief

## 2016-10-22 ENCOUNTER — Emergency Department (HOSPITAL_BASED_OUTPATIENT_CLINIC_OR_DEPARTMENT_OTHER): Payer: Medicare Other

## 2016-10-22 ENCOUNTER — Emergency Department (HOSPITAL_BASED_OUTPATIENT_CLINIC_OR_DEPARTMENT_OTHER)
Admission: EM | Admit: 2016-10-22 | Discharge: 2016-10-23 | Disposition: A | Discharge status: Home / Self Care | Payer: Medicare Other | Attending: Emergency Medicine | Admitting: Emergency Medicine

## 2016-10-22 ENCOUNTER — Encounter (HOSPITAL_BASED_OUTPATIENT_CLINIC_OR_DEPARTMENT_OTHER): Payer: Self-pay | Admitting: Emergency Medicine

## 2016-10-22 DIAGNOSIS — E039 Hypothyroidism, unspecified: Secondary | ICD-10-CM | POA: Diagnosis not present

## 2016-10-22 DIAGNOSIS — S161XXA Strain of muscle, fascia and tendon at neck level, initial encounter: Secondary | ICD-10-CM | POA: Diagnosis not present

## 2016-10-22 DIAGNOSIS — W19XXXA Unspecified fall, initial encounter: Secondary | ICD-10-CM

## 2016-10-22 DIAGNOSIS — S0093XA Contusion of unspecified part of head, initial encounter: Secondary | ICD-10-CM | POA: Diagnosis not present

## 2016-10-22 DIAGNOSIS — I10 Essential (primary) hypertension: Secondary | ICD-10-CM | POA: Insufficient documentation

## 2016-10-22 DIAGNOSIS — M25562 Pain in left knee: Secondary | ICD-10-CM | POA: Diagnosis not present

## 2016-10-22 DIAGNOSIS — M25552 Pain in left hip: Secondary | ICD-10-CM | POA: Insufficient documentation

## 2016-10-22 DIAGNOSIS — W108XXA Fall (on) (from) other stairs and steps, initial encounter: Secondary | ICD-10-CM | POA: Diagnosis not present

## 2016-10-22 DIAGNOSIS — Y929 Unspecified place or not applicable: Secondary | ICD-10-CM | POA: Diagnosis not present

## 2016-10-22 DIAGNOSIS — Y939 Activity, unspecified: Secondary | ICD-10-CM | POA: Insufficient documentation

## 2016-10-22 DIAGNOSIS — Y999 Unspecified external cause status: Secondary | ICD-10-CM | POA: Diagnosis not present

## 2016-10-22 DIAGNOSIS — S0990XA Unspecified injury of head, initial encounter: Secondary | ICD-10-CM | POA: Diagnosis present

## 2016-10-22 DIAGNOSIS — M25512 Pain in left shoulder: Secondary | ICD-10-CM | POA: Diagnosis not present

## 2016-10-22 DIAGNOSIS — I2510 Atherosclerotic heart disease of native coronary artery without angina pectoris: Secondary | ICD-10-CM | POA: Insufficient documentation

## 2016-10-22 MED ORDER — HYDROCODONE-ACETAMINOPHEN 5-325 MG PO TABS
1.0000 | ORAL_TABLET | Freq: Once | ORAL | Status: AC
  Administered 2016-10-22: 1 via ORAL
  Filled 2016-10-22: qty 1

## 2016-10-22 MED ORDER — CYCLOBENZAPRINE HCL 10 MG PO TABS: 10.0000 mg | ORAL_TABLET | Freq: Two times a day (BID) | ORAL | 0 refills | Status: AC | PRN

## 2016-10-22 NOTE — ED Notes (Signed)
Pt taken to ct/xr.

## 2016-10-22 NOTE — ED Triage Notes (Signed)
Patient fell down @ 10 steps around 10:45 this morning. Patient initially felt ok and has progressed to feeling worse. Patient is ambulatory into triage. A&Ox4. Denies LOC. Patient reports a "knot on her head", and left sided pain. Patient used ice at home, no medications PTA for pain.

## 2016-10-22 NOTE — ED Notes (Signed)
Ice pack given

## 2016-10-22 NOTE — Discharge Instructions (Signed)
All of your imaging has been normal today. Please take your Celebrex and Tylenol for pain. He will take a muscle relaxer as needed. Soak in a warm bath with Epsom salt. Heating pad to muscle aches. Follow-up with primary care doctor for symptoms not improved. Return to the ED for symptoms worsen.
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
## 2016-10-23 NOTE — ED Notes (Signed)
Pt given d/c instructions as per chart. Rx x 1. Verbalizes understanding. No questions. 

## 2017-02-08 IMAGING — DX DG HIP (WITH OR WITHOUT PELVIS) 2-3V*L*
3 series · 3 of 3 positions shown · non-contrast
Comparison: None.

CLINICAL DATA: Pt fell down 10 steps today on left side on body C/o
generalized pain all over, pain with standing to left hip HX HTN,
neck sx

EXAM:
DG HIP (WITH OR WITHOUT PELVIS) 2-3V LEFT

[pelvis ap]
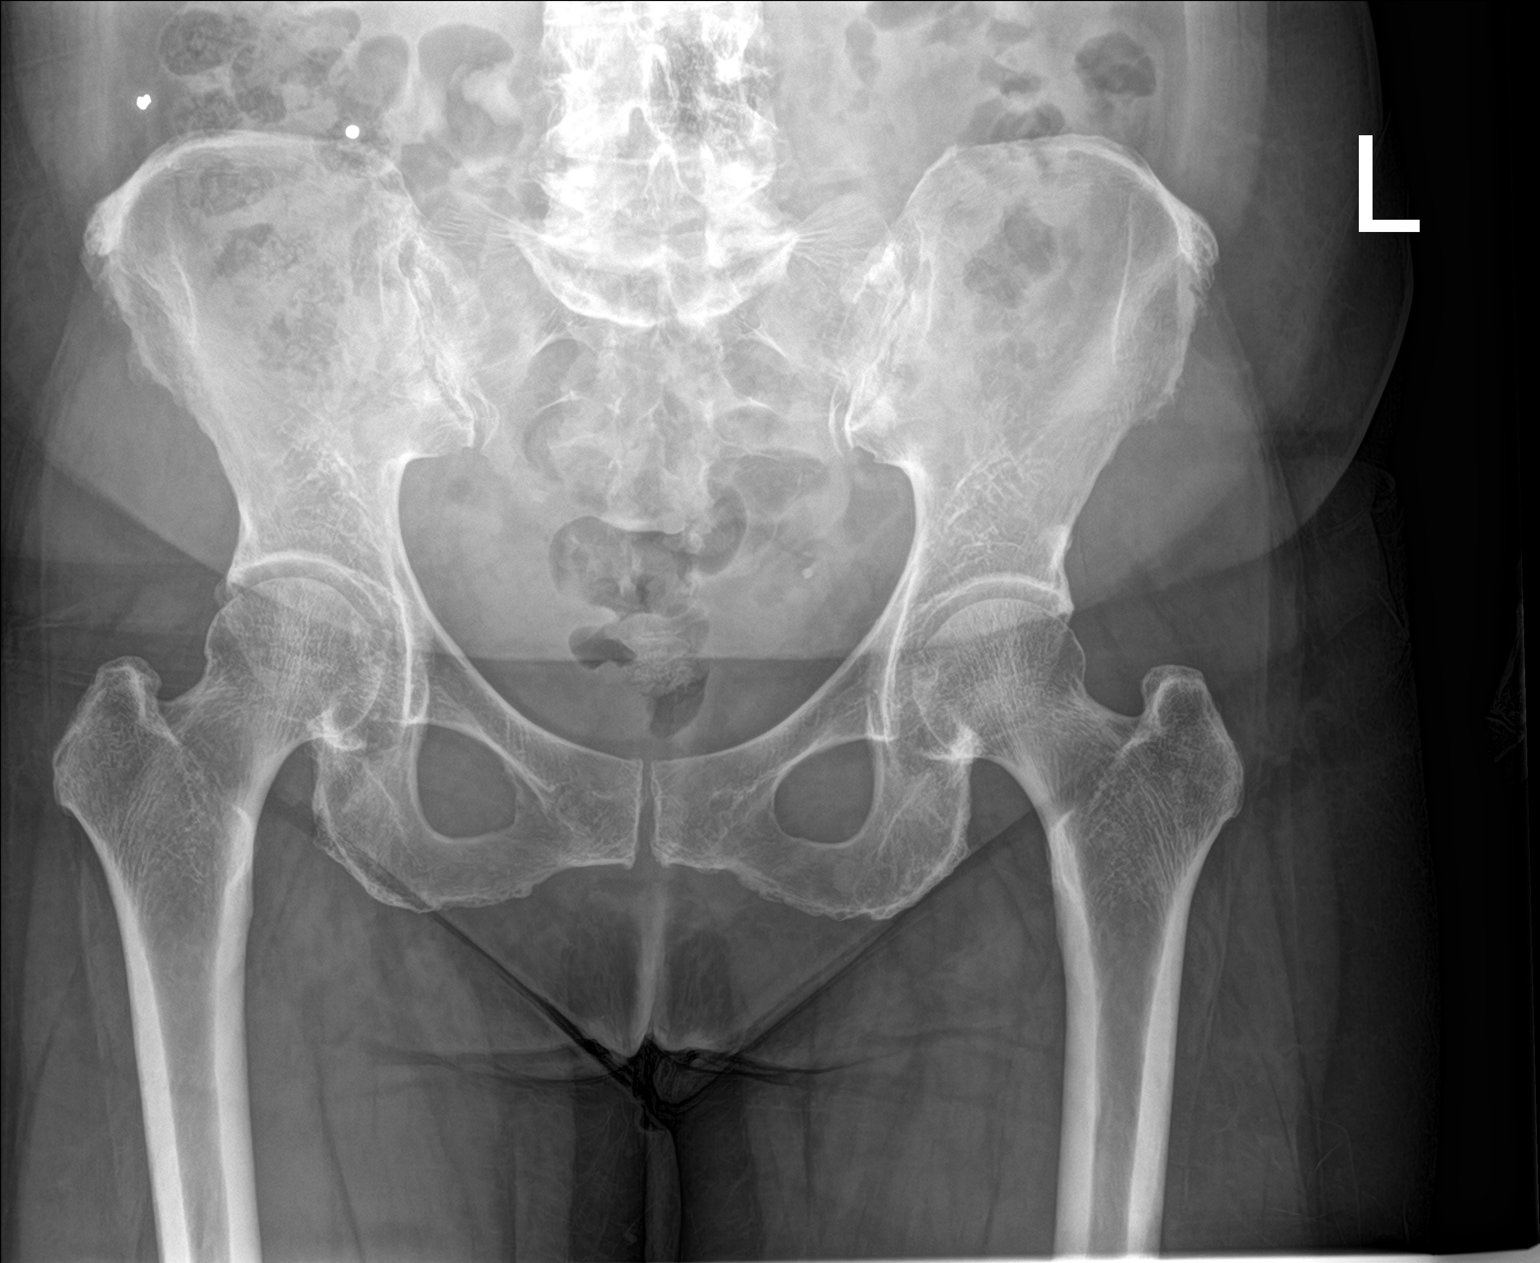

[hip ap]
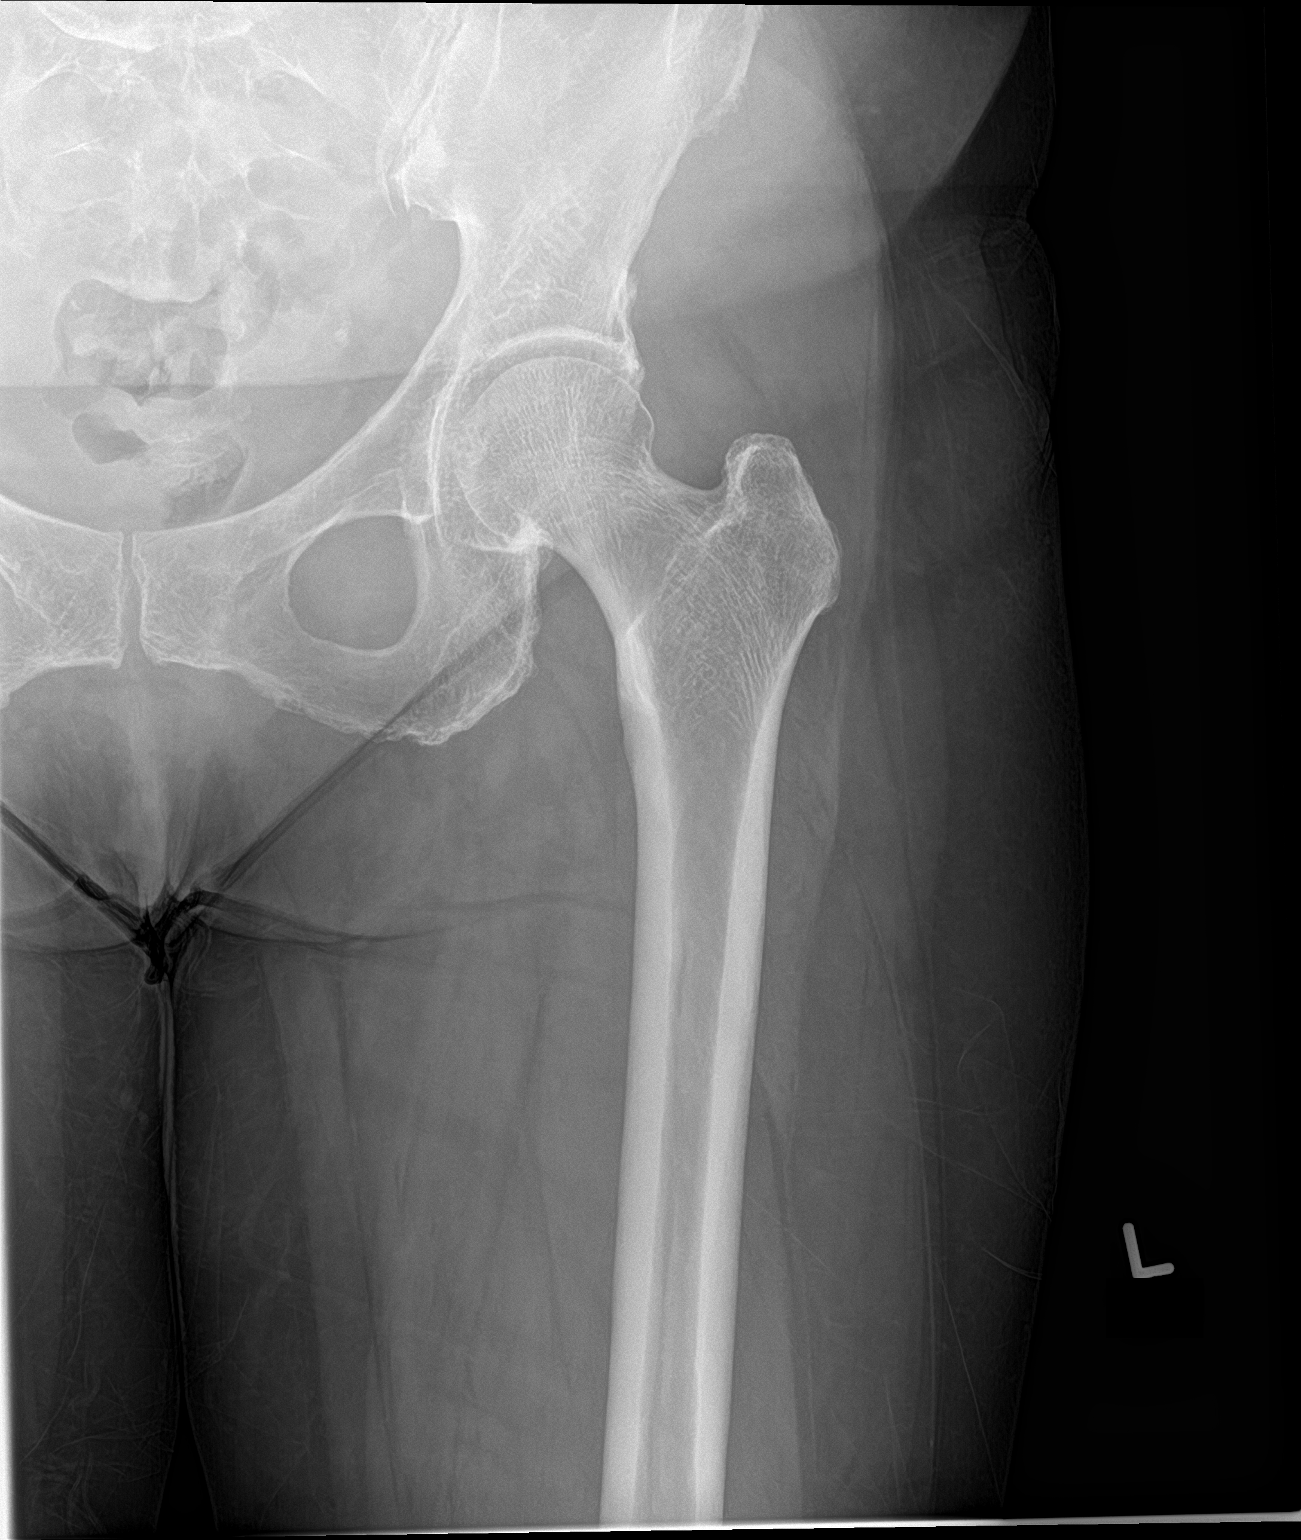

[hip lat]
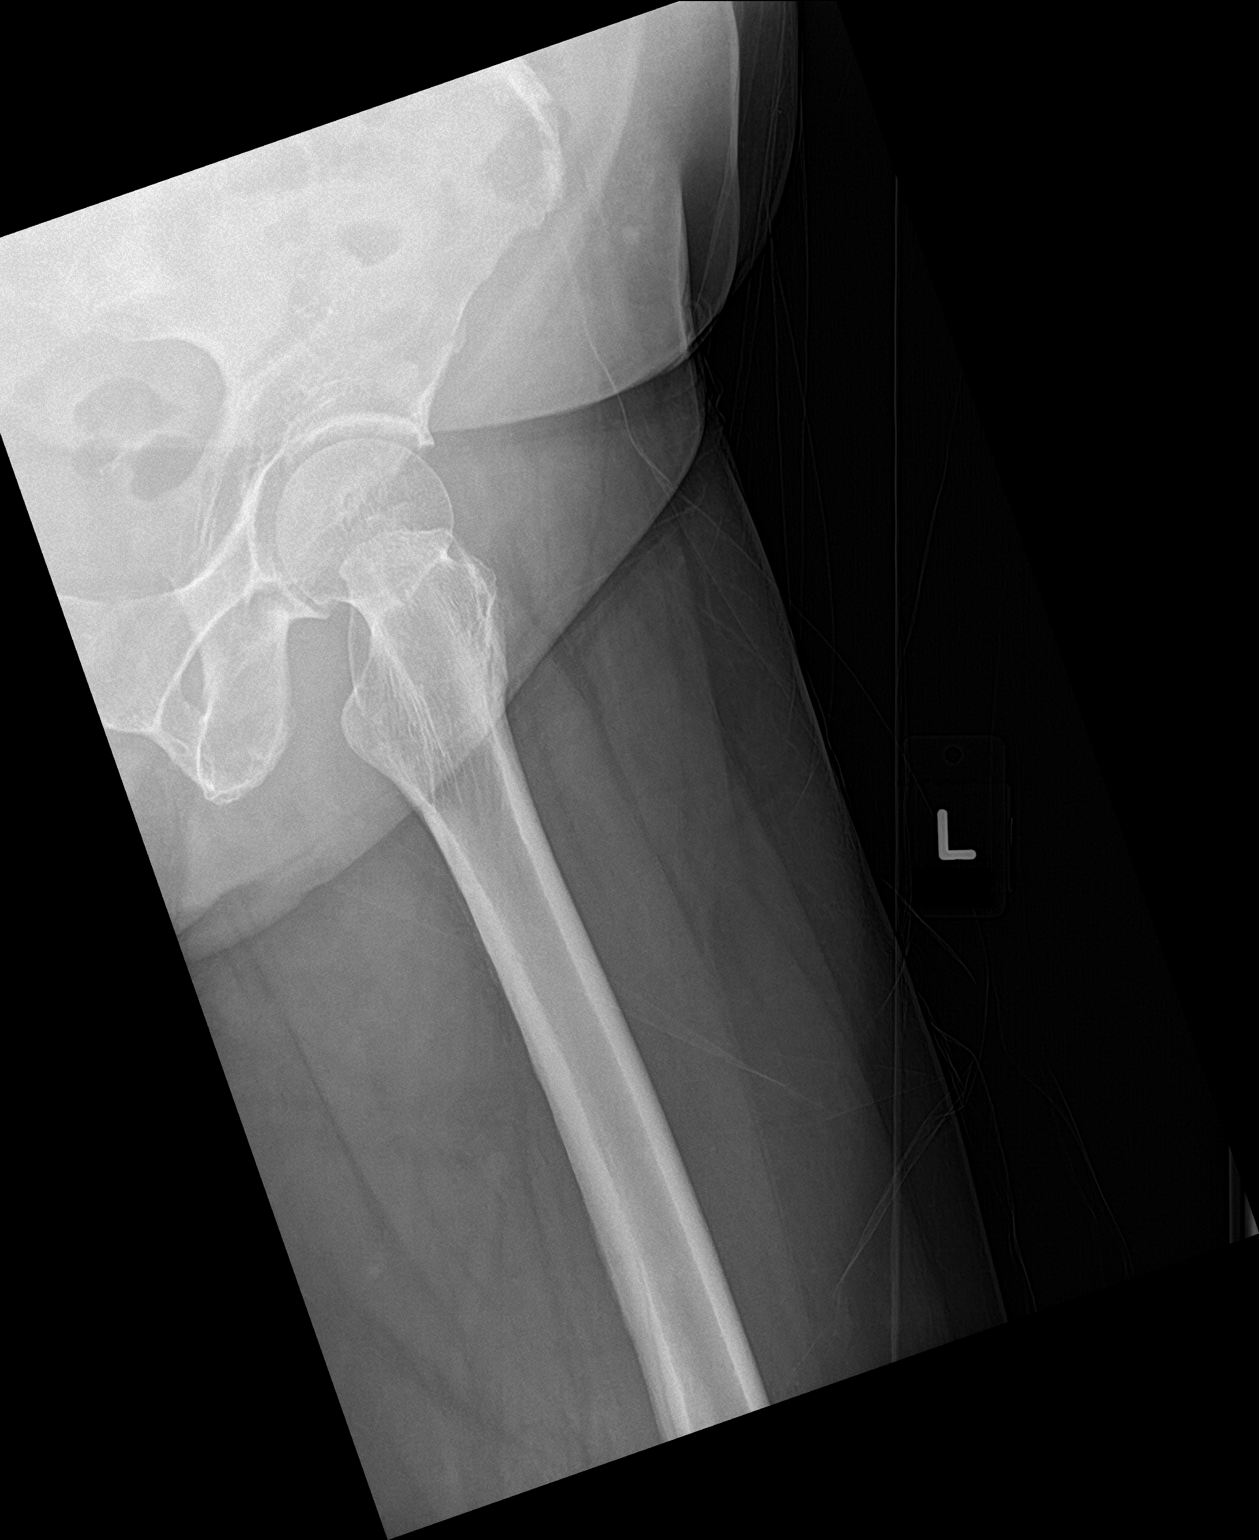

[3 of 3 positions shown; findings below may reference images not displayed]

FINDINGS: Hips are located. No evidence of pelvic fracture or sacral fracture.
Dedicated view of the LEFT hip demonstrates no femoral neck
fracture.
IMPRESSION: No pelvic fracture or hip fracture.
